# Patient Record
Sex: Female | Born: 1945 | Race: White | Hispanic: No | Marital: Married
Health system: Southern US, Community
[De-identification: ages and names within clinical notes are randomized; demographics above are authoritative.]

---

## 2010-01-08 ENCOUNTER — Inpatient Hospital Stay (HOSPITAL_COMMUNITY): Admission: RE | Admit: 2010-01-08 | Discharge: 2010-01-12 | Payer: Self-pay | Admitting: Orthopedic Surgery

## 2010-01-08 ENCOUNTER — Encounter (INDEPENDENT_AMBULATORY_CARE_PROVIDER_SITE_OTHER): Payer: Self-pay | Admitting: Orthopedic Surgery

## 2010-12-22 IMAGING — CR DG CHEST 2V
2 series · 2 of 2 positions shown · non-contrast
Comparison: None

CLINICAL DATA: Preoperative respiratory examination for right wrist
fusion.

CHEST - 2 VIEW

[view not recorded (1 of 2)]
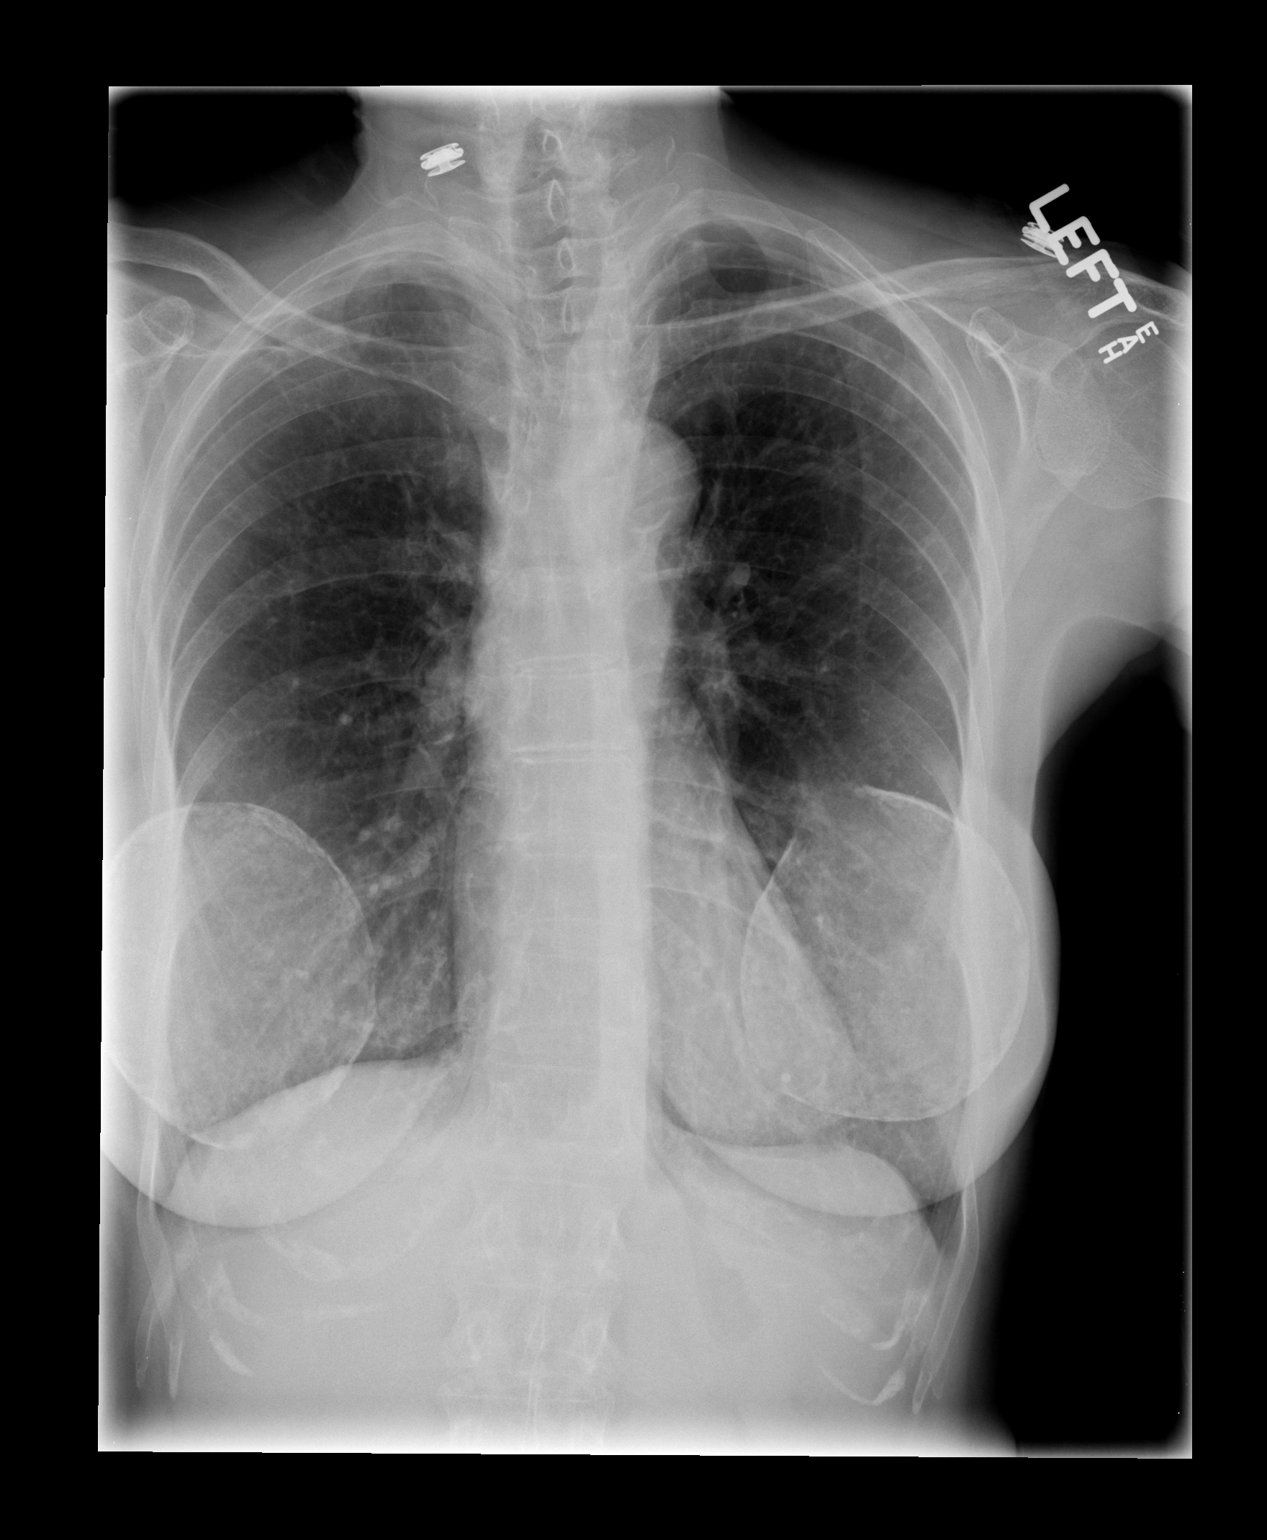

[view not recorded (2 of 2)]
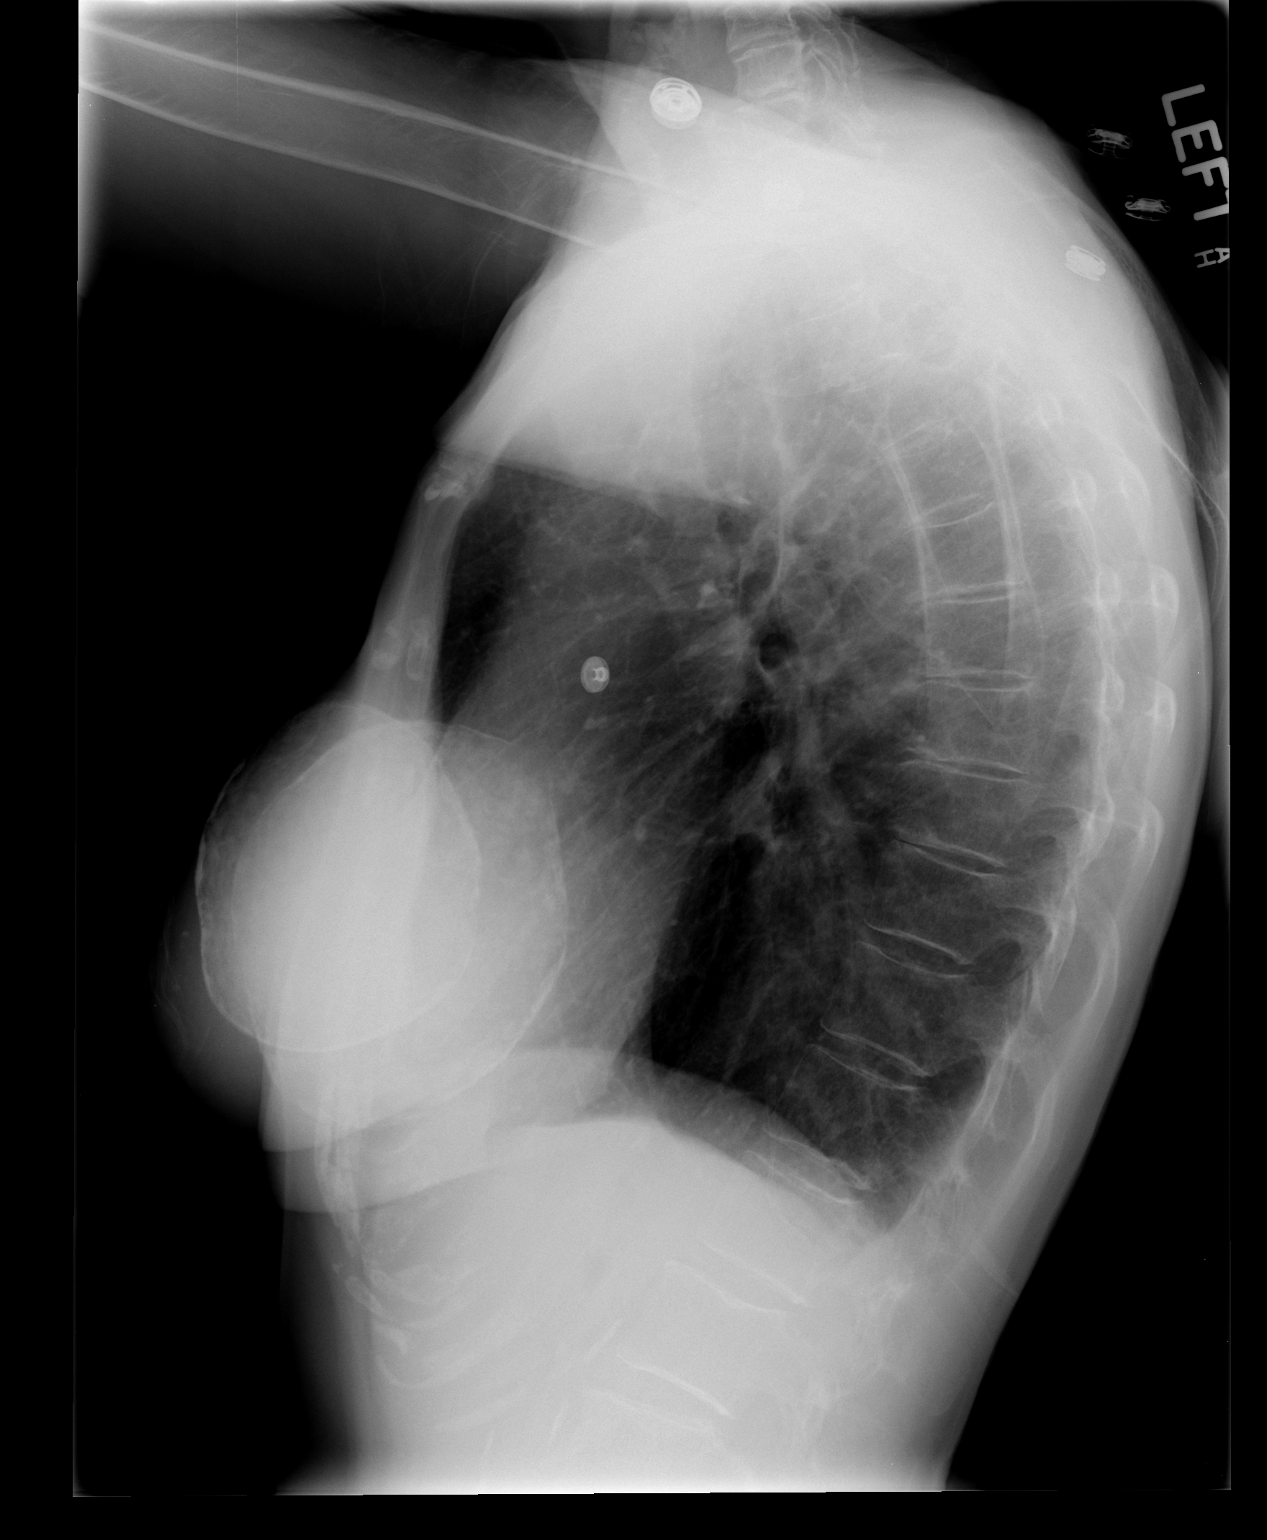

[2 of 2 positions shown; findings below may reference images not displayed]

FINDINGS: The heart size and mediastinal contours are normal.  The
lungs are hyperinflated.  There is biapical scarring.  There is a
questionable nodular density overlapping the anterior aspect of the
left second rib on the frontal examination.  This is not clearly
seen on the lateral view.  The lungs otherwise appear clear.  There
is no pleural effusion.  Calcified breast prostheses are present
bilaterally.
IMPRESSION: 1.  No acute cardiopulmonary process.
2.  Biapical scarring with possible small pulmonary nodule in the
left apex.  Alternately, this could be due to an overlap of osseous
and vascular structures.  If the patient has no other prior studies
for correlation, CT should be considered for further evaluation.
If done solely for this reason, that does not require intravenous
contrast administration.

This is a call report.

## 2011-03-09 LAB — BASIC METABOLIC PANEL
BUN: 14 mg/dL (ref 6–23)
Chloride: 103 mEq/L (ref 96–112)
GFR calc Af Amer: >60 mL/min (ref >60)
GFR calc non Af Amer: >60 mL/min (ref >60)
Glucose, Bld: 106 mg/dL — ABNORMAL HIGH (ref 70–99)
Potassium: 4.5 mEq/L (ref 3.5–5.1)

## 2011-03-09 LAB — CBC
HCT: 43.9 % (ref 36.0–46.0)
Hemoglobin: 14.8 g/dL (ref 12.0–15.0)
MCV: 92.5 fL (ref 78.0–100.0)
WBC: 6.5 10*3/uL (ref 4.0–10.5)

## 2011-03-10 LAB — GRAM STAIN

## 2011-03-10 LAB — FUNGUS CULTURE W SMEAR: Fungal Smear: NONE SEEN

## 2011-03-10 LAB — TISSUE CULTURE: Culture: NO GROWTH

## 2011-03-10 LAB — AFB CULTURE WITH SMEAR (NOT AT ARMC)

## 2011-03-10 LAB — ANAEROBIC CULTURE

## 2013-10-18 ENCOUNTER — Ambulatory Visit: Payer: Self-pay | Admitting: Pain Medicine

## 2019-11-23 ENCOUNTER — Other Ambulatory Visit: Payer: Self-pay | Admitting: Internal Medicine

## 2019-11-23 DIAGNOSIS — Z1231 Encounter for screening mammogram for malignant neoplasm of breast: Secondary | ICD-10-CM

## 2020-01-13 ENCOUNTER — Ambulatory Visit: Payer: Medicare Other | Attending: Internal Medicine

## 2020-01-13 DIAGNOSIS — Z23 Encounter for immunization: Secondary | ICD-10-CM | POA: Insufficient documentation

## 2020-01-13 NOTE — Progress Notes (Signed)
   Covid-19 Vaccination Clinic  Name:  Kelsey Pittman    MRN: [id number] DOB: May 24, 1946  01/13/2020  [name] was observed post Covid-19 immunization for 15 minutes without incidence. She was provided with Vaccine Information Sheet and instruction to access the V-Safe system.   [name] was instructed to call 911 with any severe reactions post vaccine: Marland Kitchen Difficulty breathing  . Swelling of your face and throat  . A fast heartbeat  . A bad rash all over your body  . Dizziness and weakness    Immunizations Administered    Name Date Dose VIS Date Route   Pfizer COVID-19 Vaccine 01/13/2020  6:43 PM 0.3 mL 12/02/2019 Intramuscular   Manufacturer: [employer], [employer]   Lot: [id number]   NDC: [address]-9

## 2020-02-03 ENCOUNTER — Ambulatory Visit: Payer: Medicare Other | Attending: Internal Medicine

## 2020-02-03 DIAGNOSIS — Z23 Encounter for immunization: Secondary | ICD-10-CM | POA: Insufficient documentation

## 2020-02-03 NOTE — Progress Notes (Signed)
   Covid-19 Vaccination Clinic  Name:  Kelsey Pittman    MRN: [id number] DOB: 21-Oct-1946  02/03/2020  Ms. Kelsey Pittman was observed post Covid-19 immunization for 15 minutes without incidence. She was provided with Vaccine Information Sheet and instruction to access the V-Safe system.   Ms. Kelsey Pittman was instructed to call 911 with any severe reactions post vaccine: Marland Kitchen Difficulty breathing  . Swelling of your face and throat  . A fast heartbeat  . A bad rash all over your body  . Dizziness and weakness    Immunizations Administered    Name Date Dose VIS Date Route   Pfizer COVID-19 Vaccine 02/03/2020 10:31 AM 0.3 mL 12/02/2019 Intramuscular   Manufacturer: [employer], [employer]   Lot: [id number]   NDC: [address]-6

## 2021-01-18 ENCOUNTER — Other Ambulatory Visit: Payer: Self-pay | Admitting: Internal Medicine

## 2021-01-18 DIAGNOSIS — Z1231 Encounter for screening mammogram for malignant neoplasm of breast: Secondary | ICD-10-CM

## 2021-10-31 ENCOUNTER — Other Ambulatory Visit (HOSPITAL_BASED_OUTPATIENT_CLINIC_OR_DEPARTMENT_OTHER): Payer: Self-pay | Admitting: Physician Assistant

## 2021-10-31 ENCOUNTER — Other Ambulatory Visit: Payer: Self-pay | Admitting: Physician Assistant

## 2021-10-31 ENCOUNTER — Other Ambulatory Visit (HOSPITAL_COMMUNITY): Payer: Self-pay | Admitting: Physician Assistant

## 2021-10-31 ENCOUNTER — Ambulatory Visit
Admission: RE | Admit: 2021-10-31 | Discharge: 2021-10-31 | Disposition: A | Discharge status: Home / Self Care | Payer: Medicare Other | Source: Ambulatory Visit | Attending: Physician Assistant | Admitting: Physician Assistant

## 2021-10-31 ENCOUNTER — Other Ambulatory Visit: Payer: Self-pay

## 2021-10-31 ENCOUNTER — Ambulatory Visit: Admission: RE | Admit: 2021-10-31 | Payer: Medicare Other | Source: Ambulatory Visit

## 2021-10-31 DIAGNOSIS — S32010A Wedge compression fracture of first lumbar vertebra, initial encounter for closed fracture: Secondary | ICD-10-CM
